# Patient Record
Sex: Female | Born: 1970 | Race: White | Hispanic: No | Marital: Married | Smoking: Former smoker
Health system: Southern US, Community
[De-identification: ages and names within clinical notes are randomized; demographics above are authoritative.]

## PROBLEM LIST (undated history)

## (undated) DIAGNOSIS — Z973 Presence of spectacles and contact lenses: Secondary | ICD-10-CM

## (undated) DIAGNOSIS — R112 Nausea with vomiting, unspecified: Secondary | ICD-10-CM

## (undated) DIAGNOSIS — Z8709 Personal history of other diseases of the respiratory system: Secondary | ICD-10-CM

## (undated) DIAGNOSIS — Z9889 Other specified postprocedural states: Secondary | ICD-10-CM

## (undated) DIAGNOSIS — R7303 Prediabetes: Secondary | ICD-10-CM

## (undated) DIAGNOSIS — G629 Polyneuropathy, unspecified: Secondary | ICD-10-CM

## (undated) HISTORY — PX: CARPAL TUNNEL RELEASE: SHX101

## (undated) HISTORY — PX: CHOLECYSTECTOMY: SHX55

---

## 2016-03-11 HISTORY — PX: BACK SURGERY: SHX140

## 2016-07-17 ENCOUNTER — Other Ambulatory Visit: Payer: Self-pay | Admitting: Neurosurgery

## 2016-08-10 ENCOUNTER — Encounter (HOSPITAL_COMMUNITY): Payer: Self-pay | Admitting: General Practice

## 2016-08-10 ENCOUNTER — Encounter (HOSPITAL_COMMUNITY)
Admission: RE | Admit: 2016-08-10 | Discharge: 2016-08-10 | Disposition: A | Discharge status: Home / Self Care | Payer: BLUE CROSS/BLUE SHIELD | Source: Ambulatory Visit | Attending: Neurosurgery | Admitting: Neurosurgery

## 2016-08-10 DIAGNOSIS — E119 Type 2 diabetes mellitus without complications: Secondary | ICD-10-CM | POA: Insufficient documentation

## 2016-08-10 DIAGNOSIS — Z01818 Encounter for other preprocedural examination: Secondary | ICD-10-CM | POA: Insufficient documentation

## 2016-08-10 HISTORY — DX: Presence of spectacles and contact lenses: Z97.3

## 2016-08-10 HISTORY — DX: Prediabetes: R73.03

## 2016-08-10 HISTORY — DX: Other specified postprocedural states: Z98.890

## 2016-08-10 HISTORY — DX: Nausea with vomiting, unspecified: R11.2

## 2016-08-10 HISTORY — DX: Personal history of other diseases of the respiratory system: Z87.09

## 2016-08-10 HISTORY — DX: Polyneuropathy, unspecified: G62.9

## 2016-08-10 LAB — BASIC METABOLIC PANEL
ANION GAP: 8 (ref 5–15)
BUN: 10 mg/dL (ref 6–20)
CO2: 22 mmol/L (ref 22–32)
CREATININE: 0.74 mg/dL (ref 0.44–1.00)
Calcium: 9 mg/dL (ref 8.9–10.3)
Chloride: 107 mmol/L (ref 101–111)
Glucose, Bld: 104 mg/dL — ABNORMAL HIGH (ref 65–99)
Potassium: 3.8 mmol/L (ref 3.5–5.1)
SODIUM: 137 mmol/L (ref 135–145)

## 2016-08-10 LAB — SURGICAL PCR SCREEN
MRSA, PCR: NEGATIVE
Staphylococcus aureus: NEGATIVE

## 2016-08-10 LAB — CBC
HCT: 40.2 % (ref 36.0–46.0)
HEMOGLOBIN: 13.6 g/dL (ref 12.0–15.0)
MCH: 29.8 pg (ref 26.0–34.0)
MCHC: 33.8 g/dL (ref 30.0–36.0)
MCV: 88.2 fL (ref 78.0–100.0)
PLATELETS: 275 10*3/uL (ref 150–400)
RBC: 4.56 MIL/uL (ref 3.87–5.11)
RDW: 13.3 % (ref 11.5–15.5)
WBC: 8.2 10*3/uL (ref 4.0–10.5)

## 2016-08-10 LAB — TYPE AND SCREEN
ABO/RH(D): O POS
ANTIBODY SCREEN: NEGATIVE

## 2016-08-10 LAB — ABO/RH: ABO/RH(D): O POS

## 2016-08-10 LAB — HCG, SERUM, QUALITATIVE: PREG SERUM: NEGATIVE

## 2016-08-10 NOTE — Progress Notes (Signed)
PCP - if needed, goes to [facility] [place] Cardiologist - denies  EKG/CXR - denies Echo/stress test/cardiac cath - denies  Patient denies chest pain and shortness of breath at PAT appointment.

## 2016-08-10 NOTE — Pre-Procedure Instructions (Signed)
    Linde GillisStephanie B Mayse  08/10/2016      [id number]- [place], [place] - [address] [facility] [address] [place] [id number] Phone: [phone] Fax: [phone]    Your procedure is scheduled on Thursday 08/17/2016 @1055   [facility] Admitting at  [phone].  Call this number if you have problems the morning of surgery:  [phone]   Remember:  Discontinue all aspirin products, NSAIDS - aleve/naproxen, advil/ ibuprofen, vitamin and herbal supplements fish oils 5 days prior to surgery or as instructed by your surgeon.    Do not eat food or drink liquids after midnight.  Take these medicines the morning of surgery with A SIP OF WATER  Gabapentin (Neurontin), Claritin, Loestrin/Junel   Do not wear jewelry, make-up or nail polish.  Do not wear lotions, powders, or perfumes, or deoderant.  Do not shave 48 hours prior to surgery.    Do not bring valuables to the hospital.  [facility] is not responsible for any belongings or valuables.  Contacts, dentures or bridgework may not be worn into surgery.  Leave your suitcase in the car.  After surgery it may be brought to your room.  For patients admitted to the hospital, discharge time will be determined by your treatment team.  Patients discharged the day of surgery will not be allowed to drive home.   Name and phone number of your driver:    Special instructions:   Please read over the following fact sheets that you were given. Pain Booklet, Coughing and Deep Breathing, Blood Transfusion Information, MRSA Information and Surgical Site Infection Prevention

## 2016-08-11 LAB — HEMOGLOBIN A1C
Hgb A1c MFr Bld: 5.9 % — ABNORMAL HIGH (ref 4.8–5.6)
MEAN PLASMA GLUCOSE: 123 mg/dL

## 2016-08-17 ENCOUNTER — Inpatient Hospital Stay (HOSPITAL_COMMUNITY): Payer: BLUE CROSS/BLUE SHIELD | Admitting: Certified Registered Nurse Anesthetist

## 2016-08-17 ENCOUNTER — Encounter (HOSPITAL_COMMUNITY)
Admission: RE | Disposition: A | Discharge status: Home / Self Care | Payer: Self-pay | Source: Ambulatory Visit | Attending: Neurosurgery

## 2016-08-17 ENCOUNTER — Inpatient Hospital Stay (HOSPITAL_COMMUNITY): Payer: BLUE CROSS/BLUE SHIELD

## 2016-08-17 ENCOUNTER — Inpatient Hospital Stay (HOSPITAL_COMMUNITY)
Admission: RE | Admit: 2016-08-17 | Discharge: 2016-08-18 | DRG: 455 | Disposition: A | Discharge status: Home / Self Care | Payer: BLUE CROSS/BLUE SHIELD | Source: Ambulatory Visit | Attending: Neurosurgery | Admitting: Neurosurgery

## 2016-08-17 ENCOUNTER — Encounter (HOSPITAL_COMMUNITY): Payer: Self-pay | Admitting: Certified Registered Nurse Anesthetist

## 2016-08-17 DIAGNOSIS — Z79891 Long term (current) use of opiate analgesic: Secondary | ICD-10-CM

## 2016-08-17 DIAGNOSIS — M48061 Spinal stenosis, lumbar region without neurogenic claudication: Principal | ICD-10-CM | POA: Diagnosis present

## 2016-08-17 DIAGNOSIS — M5440 Lumbago with sciatica, unspecified side: Secondary | ICD-10-CM | POA: Diagnosis present

## 2016-08-17 DIAGNOSIS — M4726 Other spondylosis with radiculopathy, lumbar region: Secondary | ICD-10-CM | POA: Diagnosis present

## 2016-08-17 DIAGNOSIS — Z79899 Other long term (current) drug therapy: Secondary | ICD-10-CM | POA: Diagnosis not present

## 2016-08-17 DIAGNOSIS — Z6835 Body mass index (BMI) 35.0-35.9, adult: Secondary | ICD-10-CM | POA: Diagnosis not present

## 2016-08-17 DIAGNOSIS — Z419 Encounter for procedure for purposes other than remedying health state, unspecified: Secondary | ICD-10-CM

## 2016-08-17 DIAGNOSIS — Z87891 Personal history of nicotine dependence: Secondary | ICD-10-CM | POA: Diagnosis not present

## 2016-08-17 DIAGNOSIS — M5126 Other intervertebral disc displacement, lumbar region: Secondary | ICD-10-CM | POA: Diagnosis present

## 2016-08-17 HISTORY — PX: MAXIMUM ACCESS (MAS)POSTERIOR LUMBAR INTERBODY FUSION (PLIF) 1 LEVEL: SHX6368

## 2016-08-17 SURGERY — FOR MAXIMUM ACCESS (MAS) POSTERIOR LUMBAR INTERBODY FUSION (PLIF) 1 LEVEL
Anesthesia: General | Site: Back

## 2016-08-17 MED ORDER — HYDROMORPHONE HCL 1 MG/ML IJ SOLN
0.5000 mg | INTRAMUSCULAR | Status: DC | PRN
  Administered 2016-08-17 – 2016-08-18 (×3): 1 mg via INTRAVENOUS
  Filled 2016-08-17 (×3): qty 1

## 2016-08-17 MED ORDER — ONDANSETRON HCL 4 MG/2ML IJ SOLN
INTRAMUSCULAR | Status: DC | PRN
  Administered 2016-08-17 (×2): 4 mg via INTRAVENOUS

## 2016-08-17 MED ORDER — BUPIVACAINE LIPOSOME 1.3 % IJ SUSP
INTRAMUSCULAR | Status: DC | PRN
  Administered 2016-08-17: 20 mL

## 2016-08-17 MED ORDER — CEFAZOLIN SODIUM-DEXTROSE 2-4 GM/100ML-% IV SOLN
INTRAVENOUS | Status: AC
  Filled 2016-08-17: qty 100

## 2016-08-17 MED ORDER — THROMBIN 5000 UNITS EX SOLR
CUTANEOUS | Status: AC
  Filled 2016-08-17: qty 5000

## 2016-08-17 MED ORDER — LIDOCAINE-EPINEPHRINE (PF) 2 %-1:200000 IJ SOLN
INTRAMUSCULAR | Status: AC
  Filled 2016-08-17: qty 20

## 2016-08-17 MED ORDER — PHENYLEPHRINE 40 MCG/ML (10ML) SYRINGE FOR IV PUSH (FOR BLOOD PRESSURE SUPPORT)
PREFILLED_SYRINGE | INTRAVENOUS | Status: AC
  Filled 2016-08-17: qty 10

## 2016-08-17 MED ORDER — METHOCARBAMOL 1000 MG/10ML IJ SOLN
500.0000 mg | INTRAVENOUS | Status: AC
  Filled 2016-08-17: qty 5

## 2016-08-17 MED ORDER — DOCUSATE SODIUM 100 MG PO CAPS
100.0000 mg | ORAL_CAPSULE | Freq: Two times a day (BID) | ORAL | Status: DC
  Administered 2016-08-17 – 2016-08-18 (×2): 100 mg via ORAL
  Filled 2016-08-17 (×2): qty 1

## 2016-08-17 MED ORDER — MENTHOL 3 MG MT LOZG
1.0000 | LOZENGE | OROMUCOSAL | Status: DC | PRN
Start: 2016-08-17 — End: 2016-08-18

## 2016-08-17 MED ORDER — BUPIVACAINE LIPOSOME 1.3 % IJ SUSP
20.0000 mL | INTRAMUSCULAR | Status: DC
  Filled 2016-08-17: qty 20

## 2016-08-17 MED ORDER — PHENOL 1.4 % MT LIQD
1.0000 | OROMUCOSAL | Status: DC | PRN
Start: 2016-08-17 — End: 2016-08-18

## 2016-08-17 MED ORDER — ONDANSETRON HCL 4 MG/2ML IJ SOLN
INTRAMUSCULAR | Status: AC
  Filled 2016-08-17: qty 8

## 2016-08-17 MED ORDER — HYDROCODONE-ACETAMINOPHEN 5-325 MG PO TABS: 1.0000 | ORAL_TABLET | ORAL | Status: DC | PRN

## 2016-08-17 MED ORDER — EPHEDRINE 5 MG/ML INJ
INTRAVENOUS | Status: AC
  Filled 2016-08-17: qty 10

## 2016-08-17 MED ORDER — PROPOFOL 10 MG/ML IV BOLUS
INTRAVENOUS | Status: AC
  Filled 2016-08-17: qty 20

## 2016-08-17 MED ORDER — LACTATED RINGERS IV SOLN
INTRAVENOUS | Status: DC
  Administered 2016-08-17: 50 mL/h via INTRAVENOUS
  Administered 2016-08-17 (×2): via INTRAVENOUS

## 2016-08-17 MED ORDER — ATROPINE SULFATE 0.4 MG/ML IV SOSY
PREFILLED_SYRINGE | INTRAVENOUS | Status: AC
  Filled 2016-08-17: qty 2.5

## 2016-08-17 MED ORDER — PANTOPRAZOLE SODIUM 40 MG IV SOLR: 40.0000 mg | Freq: Every day | INTRAVENOUS | Status: DC

## 2016-08-17 MED ORDER — ATROPINE SULFATE 0.4 MG/ML IJ SOLN
INTRAMUSCULAR | Status: DC | PRN
  Administered 2016-08-17: .1 mg via INTRAVENOUS

## 2016-08-17 MED ORDER — METHOCARBAMOL 1000 MG/10ML IJ SOLN
500.0000 mg | Freq: Four times a day (QID) | INTRAVENOUS | Status: DC | PRN
  Filled 2016-08-17: qty 5

## 2016-08-17 MED ORDER — ROCURONIUM BROMIDE 10 MG/ML (PF) SYRINGE
PREFILLED_SYRINGE | INTRAVENOUS | Status: AC
  Filled 2016-08-17: qty 10

## 2016-08-17 MED ORDER — THROMBIN 5000 UNITS EX SOLR
OROMUCOSAL | Status: DC | PRN
  Administered 2016-08-17: 12:00:00 via TOPICAL

## 2016-08-17 MED ORDER — KCL IN DEXTROSE-NACL 20-5-0.45 MEQ/L-%-% IV SOLN: INTRAVENOUS | Status: DC

## 2016-08-17 MED ORDER — METHOCARBAMOL 500 MG PO TABS
500.0000 mg | ORAL_TABLET | Freq: Four times a day (QID) | ORAL | Status: DC | PRN
  Administered 2016-08-17 – 2016-08-18 (×2): 500 mg via ORAL
  Filled 2016-08-17 (×2): qty 1

## 2016-08-17 MED ORDER — SENNOSIDES-DOCUSATE SODIUM 8.6-50 MG PO TABS: 1.0000 | ORAL_TABLET | Freq: Every evening | ORAL | Status: DC | PRN

## 2016-08-17 MED ORDER — ONDANSETRON HCL 4 MG/2ML IJ SOLN: 4.0000 mg | INTRAMUSCULAR | Status: DC | PRN

## 2016-08-17 MED ORDER — BISACODYL 10 MG RE SUPP: 10.0000 mg | Freq: Every day | RECTAL | Status: DC | PRN

## 2016-08-17 MED ORDER — ACETAMINOPHEN 10 MG/ML IV SOLN
INTRAVENOUS | Status: AC
  Administered 2016-08-17: 1000 mg
  Filled 2016-08-17: qty 100

## 2016-08-17 MED ORDER — FENTANYL CITRATE (PF) 100 MCG/2ML IJ SOLN
INTRAMUSCULAR | Status: AC
  Filled 2016-08-17: qty 4

## 2016-08-17 MED ORDER — DEXMEDETOMIDINE BOLUS VIA INFUSION
1.0000 ug/kg | Freq: Once | INTRAVENOUS | Status: AC
  Administered 2016-08-17: 94.9 ug via INTRAVENOUS

## 2016-08-17 MED ORDER — PROPOFOL 10 MG/ML IV BOLUS
INTRAVENOUS | Status: DC | PRN
  Administered 2016-08-17: 200 mg via INTRAVENOUS

## 2016-08-17 MED ORDER — EPHEDRINE SULFATE 50 MG/ML IJ SOLN
INTRAMUSCULAR | Status: DC | PRN
  Administered 2016-08-17: 5 mg via INTRAVENOUS
  Administered 2016-08-17: 10 mg via INTRAVENOUS

## 2016-08-17 MED ORDER — GLYCOPYRROLATE 0.2 MG/ML IJ SOLN
INTRAMUSCULAR | Status: DC | PRN
  Administered 2016-08-17: .2 mg via INTRAVENOUS

## 2016-08-17 MED ORDER — CEFAZOLIN IN D5W 1 GM/50ML IV SOLN
1.0000 g | Freq: Three times a day (TID) | INTRAVENOUS | Status: AC
  Administered 2016-08-17 – 2016-08-18 (×2): 1 g via INTRAVENOUS
  Filled 2016-08-17 (×2): qty 50

## 2016-08-17 MED ORDER — 0.9 % SODIUM CHLORIDE (POUR BTL) OPTIME
TOPICAL | Status: DC | PRN
  Administered 2016-08-17: 1000 mL

## 2016-08-17 MED ORDER — PHENYLEPHRINE 40 MCG/ML (10ML) SYRINGE FOR IV PUSH (FOR BLOOD PRESSURE SUPPORT)
PREFILLED_SYRINGE | INTRAVENOUS | Status: DC | PRN
  Administered 2016-08-17: 40 ug via INTRAVENOUS
  Administered 2016-08-17 (×2): 80 ug via INTRAVENOUS
  Administered 2016-08-17: 40 ug via INTRAVENOUS
  Administered 2016-08-17: 80 ug via INTRAVENOUS

## 2016-08-17 MED ORDER — THROMBIN 20000 UNITS EX SOLR
CUTANEOUS | Status: AC
  Filled 2016-08-17: qty 20000

## 2016-08-17 MED ORDER — FENTANYL CITRATE (PF) 100 MCG/2ML IJ SOLN
INTRAMUSCULAR | Status: DC | PRN
  Administered 2016-08-17 (×2): 25 ug via INTRAVENOUS
  Administered 2016-08-17 (×3): 50 ug via INTRAVENOUS
  Administered 2016-08-17 (×3): 25 ug via INTRAVENOUS
  Administered 2016-08-17: 50 ug via INTRAVENOUS
  Administered 2016-08-17: 75 ug via INTRAVENOUS

## 2016-08-17 MED ORDER — CHLORHEXIDINE GLUCONATE CLOTH 2 % EX PADS: 6.0000 | MEDICATED_PAD | Freq: Once | CUTANEOUS | Status: DC

## 2016-08-17 MED ORDER — ARTIFICIAL TEARS OP OINT
TOPICAL_OINTMENT | OPHTHALMIC | Status: AC
  Filled 2016-08-17: qty 3.5

## 2016-08-17 MED ORDER — ACETAMINOPHEN 325 MG PO TABS: 650.0000 mg | ORAL_TABLET | ORAL | Status: DC | PRN

## 2016-08-17 MED ORDER — LIDOCAINE 2% (20 MG/ML) 5 ML SYRINGE
INTRAMUSCULAR | Status: AC
  Filled 2016-08-17: qty 5

## 2016-08-17 MED ORDER — DEXAMETHASONE SODIUM PHOSPHATE 10 MG/ML IJ SOLN
INTRAMUSCULAR | Status: DC | PRN
  Administered 2016-08-17: 10 mg via INTRAVENOUS

## 2016-08-17 MED ORDER — HYDROMORPHONE HCL 1 MG/ML IJ SOLN
0.2500 mg | INTRAMUSCULAR | Status: DC | PRN
  Administered 2016-08-17 (×4): 0.5 mg via INTRAVENOUS

## 2016-08-17 MED ORDER — ZOLPIDEM TARTRATE 5 MG PO TABS: 5.0000 mg | ORAL_TABLET | Freq: Every evening | ORAL | Status: DC | PRN

## 2016-08-17 MED ORDER — ALUM & MAG HYDROXIDE-SIMETH 200-200-20 MG/5ML PO SUSP: 30.0000 mL | Freq: Four times a day (QID) | ORAL | Status: DC | PRN

## 2016-08-17 MED ORDER — MIDAZOLAM HCL 2 MG/2ML IJ SOLN
INTRAMUSCULAR | Status: AC
  Filled 2016-08-17: qty 2

## 2016-08-17 MED ORDER — FENTANYL CITRATE (PF) 100 MCG/2ML IJ SOLN
INTRAMUSCULAR | Status: AC
  Filled 2016-08-17: qty 2

## 2016-08-17 MED ORDER — PROPOFOL 500 MG/50ML IV EMUL
INTRAVENOUS | Status: DC | PRN
  Administered 2016-08-17: 50 ug/kg/min via INTRAVENOUS

## 2016-08-17 MED ORDER — NORETHIN ACE-ETH ESTRAD-FE 1-20 MG-MCG PO TABS
1.0000 | ORAL_TABLET | Freq: Every day | ORAL | Status: DC
  Administered 2016-08-18: 1 via ORAL

## 2016-08-17 MED ORDER — SODIUM CHLORIDE 0.9% FLUSH
3.0000 mL | Freq: Two times a day (BID) | INTRAVENOUS | Status: DC
  Administered 2016-08-18: 3 mL via INTRAVENOUS

## 2016-08-17 MED ORDER — MEPERIDINE HCL 25 MG/ML IJ SOLN: 6.2500 mg | INTRAMUSCULAR | Status: DC | PRN

## 2016-08-17 MED ORDER — LIDOCAINE 2% (20 MG/ML) 5 ML SYRINGE
INTRAMUSCULAR | Status: DC | PRN
  Administered 2016-08-17: 80 mg via INTRAVENOUS

## 2016-08-17 MED ORDER — PROPOFOL 10 MG/ML IV BOLUS
INTRAVENOUS | Status: AC
Start: 2016-08-17 — End: 2016-08-17
  Filled 2016-08-17: qty 60

## 2016-08-17 MED ORDER — ESCITALOPRAM OXALATE 20 MG PO TABS
20.0000 mg | ORAL_TABLET | Freq: Every day | ORAL | Status: DC
  Administered 2016-08-18: 20 mg via ORAL
  Filled 2016-08-17: qty 1

## 2016-08-17 MED ORDER — CEFAZOLIN SODIUM-DEXTROSE 2-4 GM/100ML-% IV SOLN
2.0000 g | INTRAVENOUS | Status: AC
  Administered 2016-08-17: 2 g via INTRAVENOUS

## 2016-08-17 MED ORDER — SODIUM CHLORIDE 0.9% FLUSH: 3.0000 mL | INTRAVENOUS | Status: DC | PRN

## 2016-08-17 MED ORDER — VITAMIN D 1000 UNITS PO TABS
2000.0000 [IU] | ORAL_TABLET | Freq: Every day | ORAL | Status: DC
  Administered 2016-08-18: 2000 [IU] via ORAL
  Filled 2016-08-17: qty 2

## 2016-08-17 MED ORDER — ONDANSETRON HCL 4 MG/2ML IJ SOLN
INTRAMUSCULAR | Status: AC
  Filled 2016-08-17: qty 2

## 2016-08-17 MED ORDER — PANTOPRAZOLE SODIUM 40 MG PO TBEC
40.0000 mg | DELAYED_RELEASE_TABLET | Freq: Every day | ORAL | Status: DC
  Administered 2016-08-17: 40 mg via ORAL
  Filled 2016-08-17: qty 1

## 2016-08-17 MED ORDER — OXYCODONE-ACETAMINOPHEN 5-325 MG PO TABS
1.0000 | ORAL_TABLET | ORAL | Status: DC | PRN
  Administered 2016-08-17 – 2016-08-18 (×3): 2 via ORAL
  Filled 2016-08-17 (×3): qty 2

## 2016-08-17 MED ORDER — DEXTROSE 5 % IV SOLN
500.0000 mg | INTRAVENOUS | Status: DC
  Administered 2016-08-17: 500 mg via INTRAVENOUS
  Filled 2016-08-17: qty 5

## 2016-08-17 MED ORDER — DEXAMETHASONE SODIUM PHOSPHATE 10 MG/ML IJ SOLN
INTRAMUSCULAR | Status: AC
  Filled 2016-08-17: qty 1

## 2016-08-17 MED ORDER — SALINE SPRAY 0.65 % NA SOLN
1.0000 | NASAL | Status: DC | PRN
  Filled 2016-08-17: qty 44

## 2016-08-17 MED ORDER — ACETAMINOPHEN 650 MG RE SUPP: 650.0000 mg | RECTAL | Status: DC | PRN

## 2016-08-17 MED ORDER — SURGIFOAM 100 EX MISC
CUTANEOUS | Status: DC | PRN
  Administered 2016-08-17: 12:00:00 via TOPICAL

## 2016-08-17 MED ORDER — LORATADINE 10 MG PO TABS
10.0000 mg | ORAL_TABLET | Freq: Every day | ORAL | Status: DC
  Administered 2016-08-18: 10 mg via ORAL
  Filled 2016-08-17: qty 1

## 2016-08-17 MED ORDER — GABAPENTIN 100 MG PO CAPS
100.0000 mg | ORAL_CAPSULE | Freq: Three times a day (TID) | ORAL | Status: DC
  Administered 2016-08-17 – 2016-08-18 (×3): 100 mg via ORAL
  Filled 2016-08-17 (×3): qty 1

## 2016-08-17 MED ORDER — FLEET ENEMA 7-19 GM/118ML RE ENEM: 1.0000 | ENEMA | Freq: Once | RECTAL | Status: DC | PRN

## 2016-08-17 MED ORDER — HYDROMORPHONE HCL 1 MG/ML IJ SOLN
INTRAMUSCULAR | Status: AC
  Administered 2016-08-17: 0.5 mg via INTRAVENOUS
  Filled 2016-08-17: qty 1

## 2016-08-17 MED ORDER — ONDANSETRON HCL 4 MG/2ML IJ SOLN: 4.0000 mg | Freq: Once | INTRAMUSCULAR | Status: DC | PRN

## 2016-08-17 MED ORDER — BUPIVACAINE HCL (PF) 0.5 % IJ SOLN
INTRAMUSCULAR | Status: DC | PRN
  Administered 2016-08-17: 10 mL

## 2016-08-17 MED ORDER — MIDAZOLAM HCL 5 MG/5ML IJ SOLN
INTRAMUSCULAR | Status: DC | PRN
  Administered 2016-08-17 (×2): 1 mg via INTRAVENOUS

## 2016-08-17 MED ORDER — BUPIVACAINE HCL (PF) 0.5 % IJ SOLN
INTRAMUSCULAR | Status: AC
Start: 2016-08-17 — End: 2016-08-17
  Filled 2016-08-17: qty 30

## 2016-08-17 MED ORDER — LIDOCAINE-EPINEPHRINE 1 %-1:100000 IJ SOLN
INTRAMUSCULAR | Status: DC | PRN
  Administered 2016-08-17: 10 mL

## 2016-08-17 MED ORDER — SUCCINYLCHOLINE CHLORIDE 200 MG/10ML IV SOSY
PREFILLED_SYRINGE | INTRAVENOUS | Status: DC | PRN
  Administered 2016-08-17: 40 mg via INTRAVENOUS
  Administered 2016-08-17: 120 mg via INTRAVENOUS

## 2016-08-17 SURGICAL SUPPLY — 83 items
BASKET BONE COLLECTION (BASKET) ×4 IMPLANT
BLADE CLIPPER SURG (BLADE) IMPLANT
BONE CANC CHIPS 20CC PCAN1/4 (Bone Implant) ×3 IMPLANT
BONE MATRIX OSTEOCEL PRO MED (Bone Implant) ×3 IMPLANT
BUR MATCHSTICK NEURO 3.0 LAGG (BURR) ×3 IMPLANT
BUR ROUND FLUTED 5 RND (BURR) ×2 IMPLANT
BUR ROUND FLUTED 5MM RND (BURR) ×1
CAGE PLIF 8X9X23-12 LUMBAR (Cage) ×6 IMPLANT
CANISTER SUCT 3000ML PPV (MISCELLANEOUS) ×3 IMPLANT
CAP RELINE MOD TULIP RMM (Cap) ×6 IMPLANT
CARTRIDGE OIL MAESTRO DRILL (MISCELLANEOUS) ×1 IMPLANT
CHIPS CANC BONE 20CC PCAN1/4 (Bone Implant) ×1 IMPLANT
CLIP NEUROVISION LG (CLIP) ×3 IMPLANT
CONT SPEC 4OZ CLIKSEAL STRL BL (MISCELLANEOUS) ×3 IMPLANT
COVER BACK TABLE 24X17X13 BIG (DRAPES) IMPLANT
COVER BACK TABLE 60X90IN (DRAPES) ×3 IMPLANT
DECANTER SPIKE VIAL GLASS SM (MISCELLANEOUS) ×3 IMPLANT
DERMABOND ADVANCED (GAUZE/BANDAGES/DRESSINGS) ×2
DERMABOND ADVANCED .7 DNX12 (GAUZE/BANDAGES/DRESSINGS) ×1 IMPLANT
DIFFUSER DRILL AIR PNEUMATIC (MISCELLANEOUS) ×3 IMPLANT
DRAPE C-ARM 42X72 X-RAY (DRAPES) ×3 IMPLANT
DRAPE C-ARMOR (DRAPES) ×3 IMPLANT
DRAPE LAPAROTOMY 100X72X124 (DRAPES) ×3 IMPLANT
DRAPE POUCH INSTRU U-SHP 10X18 (DRAPES) ×3 IMPLANT
DRAPE SURG 17X23 STRL (DRAPES) ×3 IMPLANT
DRSG OPSITE POSTOP 4X6 (GAUZE/BANDAGES/DRESSINGS) ×3 IMPLANT
DURAPREP 26ML APPLICATOR (WOUND CARE) ×3 IMPLANT
ELECT BLADE 4.0 EZ CLEAN MEGAD (MISCELLANEOUS) ×3
ELECT REM PT RETURN 9FT ADLT (ELECTROSURGICAL) ×3
ELECTRODE BLDE 4.0 EZ CLN MEGD (MISCELLANEOUS) ×1 IMPLANT
ELECTRODE REM PT RTRN 9FT ADLT (ELECTROSURGICAL) ×1 IMPLANT
EVACUATOR 1/8 PVC DRAIN (DRAIN) ×3 IMPLANT
GAUZE SPONGE 4X4 12PLY STRL (GAUZE/BANDAGES/DRESSINGS) ×3 IMPLANT
GAUZE SPONGE 4X4 16PLY XRAY LF (GAUZE/BANDAGES/DRESSINGS) IMPLANT
GLOVE BIO SURGEON STRL SZ7 (GLOVE) ×12 IMPLANT
GLOVE BIO SURGEON STRL SZ8 (GLOVE) ×6 IMPLANT
GLOVE BIOGEL PI IND STRL 8 (GLOVE) ×2 IMPLANT
GLOVE BIOGEL PI IND STRL 8.5 (GLOVE) ×2 IMPLANT
GLOVE BIOGEL PI INDICATOR 8 (GLOVE) ×4
GLOVE BIOGEL PI INDICATOR 8.5 (GLOVE) ×4
GLOVE ECLIPSE 8.0 STRL XLNG CF (GLOVE) ×6 IMPLANT
GLOVE EXAM NITRILE LRG STRL (GLOVE) IMPLANT
GLOVE EXAM NITRILE XL STR (GLOVE) IMPLANT
GLOVE EXAM NITRILE XS STR PU (GLOVE) IMPLANT
GLOVE INDICATOR 7.5 STRL GRN (GLOVE) ×12 IMPLANT
GOWN STRL REUS W/ TWL LRG LVL3 (GOWN DISPOSABLE) IMPLANT
GOWN STRL REUS W/ TWL XL LVL3 (GOWN DISPOSABLE) ×1 IMPLANT
GOWN STRL REUS W/TWL 2XL LVL3 (GOWN DISPOSABLE) ×3 IMPLANT
GOWN STRL REUS W/TWL LRG LVL3 (GOWN DISPOSABLE)
GOWN STRL REUS W/TWL XL LVL3 (GOWN DISPOSABLE) ×2
HEMOSTAT POWDER SURGIFOAM 1G (HEMOSTASIS) ×3 IMPLANT
KIT BASIN OR (CUSTOM PROCEDURE TRAY) ×3 IMPLANT
KIT POSITION SURG JACKSON T1 (MISCELLANEOUS) ×3 IMPLANT
KIT ROOM TURNOVER OR (KITS) ×3 IMPLANT
MILL MEDIUM DISP (BLADE) ×3 IMPLANT
MODULE NVM5 NEXT GEN EMG (NEEDLE) ×3 IMPLANT
NEEDLE HYPO 25X1 1.5 SAFETY (NEEDLE) ×3 IMPLANT
NEEDLE SPNL 18GX3.5 QUINCKE PK (NEEDLE) IMPLANT
NS IRRIG 1000ML POUR BTL (IV SOLUTION) ×3 IMPLANT
OIL CARTRIDGE MAESTRO DRILL (MISCELLANEOUS) ×3
PACK LAMINECTOMY NEURO (CUSTOM PROCEDURE TRAY) ×3 IMPLANT
PAD ARMBOARD 7.5X6 YLW CONV (MISCELLANEOUS) ×9 IMPLANT
PATTIES SURGICAL .5 X.5 (GAUZE/BANDAGES/DRESSINGS) IMPLANT
PATTIES SURGICAL .5 X1 (DISPOSABLE) IMPLANT
PATTIES SURGICAL 1X1 (DISPOSABLE) IMPLANT
ROD RELINE COCR LORD 5.0X35 (Rod) ×3 IMPLANT
ROD RELINE COCR LORD 5X40MM (Rod) ×3 IMPLANT
SCREW LOCK RSS 4.5/5.0MM (Screw) ×12 IMPLANT
SCREW RELINE RMM 6.5X35 4S (Screw) ×6 IMPLANT
SCREW SHANK RELINE MOD 5.5X35 (Screw) ×6 IMPLANT
SPONGE LAP 4X18 X RAY DECT (DISPOSABLE) IMPLANT
SPONGE SURGIFOAM ABS GEL 100 (HEMOSTASIS) ×3 IMPLANT
STAPLER SKIN PROX WIDE 3.9 (STAPLE) IMPLANT
SUT VIC AB 1 CT1 18XBRD ANBCTR (SUTURE) ×2 IMPLANT
SUT VIC AB 1 CT1 8-18 (SUTURE) ×4
SUT VIC AB 2-0 CT1 18 (SUTURE) ×6 IMPLANT
SUT VIC AB 3-0 SH 8-18 (SUTURE) ×6 IMPLANT
SYR 5ML LL (SYRINGE) IMPLANT
SYRINGE 20CC LL (MISCELLANEOUS) ×3 IMPLANT
TOWEL OR 17X24 6PK STRL BLUE (TOWEL DISPOSABLE) ×6 IMPLANT
TOWEL OR 17X26 10 PK STRL BLUE (TOWEL DISPOSABLE) ×3 IMPLANT
TRAY FOLEY W/METER SILVER 16FR (SET/KITS/TRAYS/PACK) ×3 IMPLANT
WATER STERILE IRR 1000ML POUR (IV SOLUTION) ×3 IMPLANT

## 2016-08-17 NOTE — Anesthesia Procedure Notes (Signed)
Procedure Name: Intubation Date/Time: 08/17/2016 12:35 PM Performed by: Daiva EvesAVENEL, Era Parr W Pre-anesthesia Checklist: Patient identified, Emergency Drugs available, Suction available, Patient being monitored and Timeout performed Patient Re-evaluated:Patient Re-evaluated prior to inductionOxygen Delivery Method: Circle system utilized Preoxygenation: Pre-oxygenation with 100% oxygen Intubation Type: IV induction Ventilation: Mask ventilation without difficulty Laryngoscope Size: Glidescope and 4 Grade View: Grade I Tube type: Oral Tube size: 7.0 mm Number of attempts: 2 Airway Equipment and Method: Stylet and Video-laryngoscopy Placement Confirmation: positive ETCO2,  breath sounds checked- equal and bilateral and ETT inserted through vocal cords under direct vision Secured at: 22 cm Tube secured with: Tape Dental Injury: Teeth and Oropharynx as per pre-operative assessment  Difficulty Due To: Difficulty was unanticipated Comments: Very tight oral opening after induction with succs (+promintent teeth). Grade 3 view with MAC 3- epiglottis view only. esoph intub- ett removed and then glidescope 4 utilized- grade 1 view.

## 2016-08-17 NOTE — OR Nursing (Signed)
Dr. Noreene LarssonJoslin at bedside.  Pt continues with 10/10 pain after Dilaudid, Robaxin and IV tylenol.  Precedex ordered and given.

## 2016-08-17 NOTE — Progress Notes (Addendum)
Patient ID: Audrey GillisStephanie B Mcmullen, female   DOB: 08/16/1971, 45 y.o.   MRN: [id number] Just arrived to 3C02, alert, ambulates to bed, reporting no pain at present.  Recalls lumbar pain earlier, "but none now".  Good strength BLE.   Patient is doing well.

## 2016-08-17 NOTE — Op Note (Signed)
08/17/2016  2:58 PM  PATIENT:  Audrey Mora  45 y.o. 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, degenerative disc disease, recurrent disc herniation, radiculopathy, lumbago, morbid obesity   PROCEDURE:  Procedure(s): LUMBAR FIVE -SACRAL ONE REDO AND DISKECTOMY DECOMPRESSION WITH MAXIMUM ACCESS POSTERIOR LUMBAR INTERBODY FUSION (N/A) with Posterolateral arthrodesis L 5 - S 1 levels  Decompression greater than for standard PLIF procedure  SURGEON:  Surgeon(s) and Role:    * Del Wiseman, MD - Primary    * Gary Cram, MD - Assisting  PHYSICIAN ASSISTANT:   ASSISTANTS: Poteat, RN   ANESTHESIA:   general  EBL:  Total I/O In: 2000 [I.V.:2000] Out: 400 [Urine:150; Blood:250]  BLOOD ADMINISTERED:none  DRAINS: none   LOCAL MEDICATIONS USED:  MARCAINE    and LIDOCAINE   SPECIMEN:  No Specimen  DISPOSITION OF SPECIMEN:  N/A  COUNTS:  YES  TOURNIQUET:  * No tourniquets in log *  DICTATION: DICTATION: Patient is a 45-year-old with spondylosis , stenosis, recurrent disc herniation and severe back and left lower extremity pain at L5 S 1 levels of the lumbar spine. It was elected to take her to surgery for MASPLIF L 5 S 1 level with posterolateral arthrodesis.  Procedure:   Following uncomplicated induction of GETA, and placement of electrodes for neural monitoring, patient was turned into a prone position on the Jackson tableand using AP  fluoroscopy the area of planned incision was marked, prepped with betadine scrub and Duraprep, then draped. Exposure was performed of facet joint complex at L 5 S 1 level and the MAS retractor was placed.5.5 x 35 mm cortical Nuvasive screws were placed at L 5 bilaterally according to standard landmarks using neural monitoring.  A total  laminectomy of L 5 was then performed with disarticulation of facets.  This bone was saved for grafting, combined with Osteocel after being run through bone mill and was placed in bone packing device.  There was a large recurrent disc herniation, which was painstakingly mobilized and removed on both sides of the midline, particularly on the left, where there was dense scar tissue.  Decompression was greater than standard PLIF procedure.  Bilateral L 5 and S 1 nerve roots and thecal sac were carefully and completely decompressed.  Thorough discectomy was performed bilaterally at L 5 S 1 and the endplates were prepared for grafting.  23 x 8 x 12 degree cages were placed in the interspace and positioning was confirmed with AP and lateral fluoroscopy.  10 cc of autograft/Osteocel was packed in the interspace medial to the second cage.   Remaining screws were placed at S 1  and rods were placed.   And the screws were locked and torqued.Final Xrays showed well positioned implants and screw fixation. The posterolateral region was packed with remaining 10 cc of autograft on each side of midline. The wounds were irrigated and then closed with 1, 2-0 and 3-0 Vicryl stitches. Long-acting Marcaine was injected into the deep musculature.  Sterile occlusive dressing was placed with Dermabond. The patient was then extubated in the operating room and taken to recovery in stable and satisfactory condition having tolerated her operation well. Counts were correct at the end of the case.  PLAN OF CARE: Admit to inpatient   PATIENT DISPOSITION:  PACU - hemodynamically stable.   Delay   start of Pharmacological VTE agent (>24hrs) due to surgical blood loss or risk of bleeding: yes

## 2016-08-17 NOTE — Transfer of Care (Signed)
Immediate Anesthesia Transfer of Care Note  Patient: Linde GillisStephanie B Stober  Procedure(s) Performed: Procedure(s): LUMBAR FIVE -SACRAL ONE REDO AND DISKECTOMY DECOMPRESSION WITH MAXIMUM ACCESS POSTERIOR LUMBAR INTERBODY FUSION (N/A)  Patient Location: PACU  Anesthesia Type:General  Level of Consciousness: awake, alert  and oriented  Airway & Oxygen Therapy: Patient Spontanous Breathing and Patient connected to nasal cannula oxygen  Post-op Assessment: Report given to RN and Post -op Vital signs reviewed and stable  Post vital signs: Reviewed and stable  Last Vitals:  Vitals:   08/17/16 0916  BP: 127/75  Pulse: 81  Resp: 18  Temp: 36.9 C    Last Pain:  Vitals:   08/17/16 0935  TempSrc:   PainSc: 6       Patients Stated Pain Goal: 2 (08/17/16 0935)  Complications: No apparent anesthesia complications

## 2016-08-17 NOTE — H&P (Signed)
Patient ID:   [id number] Patient: Audrey Mora  Date of Birth: 29-Aug-1971 Visit Type: Office Visit   Date: 07/13/2016 01:45 PM Provider: Danae OrleansJoseph D. Venetia MaxonStern MD   This 45 year old female presents for back pain.  History of Present Illness: 1.  back pain    Pt returns to review her imaging.  The patient states that she is not getting any better.  She's having much more pain into her left leg and also feels that she is getting pain into both of her legs at this point.  Her repeat MRI shows a large recurrent disc herniation at L5-S1 which is eccentric leftward approaches causing significant stenosis and bilateral S1 nerve root compression.  In addition there are endplate degenerative changes and significant disc height loss at this level.  As minimal disc bulging at the L4 L5 level but there is significant disc herniation at the L5-S1 level.  On examination today the patient continues to have significant discomfort.  She is positive straight leg raise on the left and cross straight leg raise on the right for left lower extremity pain she is difficulty standing on her toes on the left and has symmetric reflexes at the knees with the absent ankle jerk on the left and one on the right greater than going to plantar stimulation.  She has decreased pain sensation left S1 distribution.  I lengthy talk with the patient about treatment options.  In light of the large recurrent disc herniation and significant back pain as well as bilateral lower extremity symptoms and the marked disc degeneration at the L5-S1 level, I recommended proceeding with redo discectomy but with fusion at this level.  The patient states that she does not want under any circumstances to have to have additional surgery and is aware that she has a significant possibility of re-rupturing the disc even if we do a redo discectomy.  I also think with the extent of degeneration at this level that a redo discectomy is not likely to be of much  benefit to her and that she will continue to have significant low back pain.      Medical/Surgical/Interim History Reviewed, no change.  Last detailed document date:03/08/2016.   PAST MEDICAL HISTORY, SURGICAL HISTORY, FAMILY HISTORY, SOCIAL HISTORY AND REVIEW OF SYSTEMS I have reviewed the patient's past medical, surgical, family and social history as well as the comprehensive review of systems as included on the [facility] history form dated 03/08/2016, which I have signed.  Family History: Reviewed, no changes.  Last detailed document: 03/08/2016.   Social History: Tobacco use reviewed. Reviewed, no changes. Last detailed document date: 03/08/2016.      MEDICATIONS(added, continued or stopped this visit): Started Medication Directions Instruction Stopped   escitalopram 20 mg tablet take 1 tablet by oral route  every day     gabapentin 100 mg capsule take 3 capsule by oral route 3 times every day     Junel 1.5/30 (21) 1.5 mg-30 mcg tablet take 1 tablet by oral route  every day    06/19/2016 Medrol (Pak) 4 mg tablets in a dose pack take as directed  07/13/2016  04/05/2016 Percocet 5 mg-325 mg tablet take 1-2 tablets by oral route  every 4 hours as needed for pain    03/08/2016 tizanidine 2 mg tablet take 1-2 tablet by oral route  every 8 hours as needed for spasm     vitamin b12 Injections as directed     Vitamin D3  2,000 unit capsule Take 1 capsule daily     Xyzal take 1 tablet by oral route  every day in the evening       ALLERGIES: Ingredient Reaction Medication Name Comment  NO KNOWN ALLERGIES     No known allergies.    Vitals Date Temp F BP Pulse Ht In Wt Lb BMI BSA Pain Score  07/13/2016  135/86 91 64 209 35.87  5/10      IMPRESSION Proceed with redo discectomy with MAS PLIF L5-S1 level.  This is for significant degeneration, back pain, bilateral lower extremity pain, recurrent radiculopathy, recurrent disc herniation and  significant stenosis with foraminal stenosis.  Completed Orders (this encounter) Order Details Reason Side Interpretation Result Initial Treatment Date Region  Giving encouragement to exercise Encourage patient to eat a well balanced diet and follow up with primary physcian.         Assessment/Plan # Detail Type Description   1. Assessment Radiculopathy, lumbar region (M54.16).       2. Assessment Herniated nucleus pulposus, lumbar (M51.26).       3. Assessment Low back pain, unspecified back pain laterality, with sciatica presence unspecified (M54.5).       4. 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       5. Assessment Body mass index (BMI) 35.0-35.9, adult (Z68.35).   Plan Orders Today's instructions / counseling include(s) Giving encouragement to exercise.       6. Assessment Degenerative lumbar spinal stenosis (M48.061).   Plan Orders Aspen Lo Sag Rigid Panel Quick.         Pain Assessment/Treatment Pain Scale: 5/10. Method: Numeric Pain Intensity Scale. Location: back. Onset: 10/09/2015. Duration: varies. Quality: discomforting. Pain Assessment/Treatment follow-up plan of care: Pateint taking medication as prescribed..  As above.  The patient was fitted for an LSO brace.  Risks and benefits were discussed in detail with the patient and she wishes to proceed with surgery.  Orders: Diagnostic Procedures: Assessment Procedure  M48.061 Lumbar Spine- AP/Lat  Instruction(s)/Education: Assessment Instruction  Z68.35 Giving encouragement to exercise  Miscellaneous: Assessment   M48.061 Aspen Lo Sag Rigid Panel Quick             Provider:  Danae OrleansJoseph D. Venetia MaxonStern MD  07/14/2016 04:13 PM Dictation edited by: Danae OrleansJoseph D. Venetia MaxonStern    CC Providers: Sharen HonesAnna Voytek [facility] [address] [place], [place] [id number]              Electronically signed by Danae OrleansJoseph D. Venetia MaxonStern MD on  07/14/2016 04:14 PM

## 2016-08-17 NOTE — Anesthesia Preprocedure Evaluation (Addendum)
Anesthesia Evaluation  Patient identified by MRN, date of birth, ID band Patient awake    Reviewed: Allergy & Precautions, NPO status , Patient's Chart, lab work & pertinent test results  History of Anesthesia Complications (+) PONV  Airway Mallampati: II  TM Distance: >3 FB Neck ROM: Full    Dental  (+) Teeth Intact, Dental Advisory Given   Pulmonary former smoker,    Pulmonary exam normal breath sounds clear to auscultation       Cardiovascular Normal cardiovascular exam Rhythm:Regular Rate:Normal     Neuro/Psych    GI/Hepatic   Endo/Other    Renal/GU      Musculoskeletal   Abdominal   Peds  Hematology   Anesthesia Other Findings   Reproductive/Obstetrics                            Anesthesia Physical Anesthesia Plan  ASA: II  Anesthesia Plan: General   Post-op Pain Management:    Induction: Intravenous  Airway Management Planned: Oral ETT  Additional Equipment:   Intra-op Plan:   Post-operative Plan: Extubation in OR  Informed Consent: I have reviewed the patients History and Physical, chart, labs and discussed the procedure including the risks, benefits and alternatives for the proposed anesthesia with the patient or authorized representative who has indicated his/her understanding and acceptance.   Dental advisory given  Plan Discussed with: CRNA and Surgeon  Anesthesia Plan Comments:        Anesthesia Quick Evaluation

## 2016-08-17 NOTE — Interval H&P Note (Signed)
History and Physical Interval Note:  08/17/2016 11:28 AM  Audrey Mora  has presented today for surgery, with the diagnosis of DEGENERATIVE LUMBAR SPINAL STENOSIS  The various methods of treatment have been discussed with the patient and family. After consideration of risks, benefits and other options for treatment, the patient has consented to  Procedure(s) with comments: L5-S1 REDO DECOMPRESSION WITH MAXIMUM ACCESS POSTERIOR LUMBAR INTERBODY FUSION (N/A) - L5-S1 REDO DECOMPRESSION WITH MAXIMUM ACCESS POSTERIOR LUMBAR INTERBODY FUSION as a surgical intervention .  The patient's history has been reviewed, patient examined, no change in status, stable for surgery.  I have reviewed the patient's chart and labs.  Questions were answered to the patient's satisfaction.     [name] D

## 2016-08-17 NOTE — Brief Op Note (Signed)
08/17/2016  2:58 PM  PATIENT:  Audrey Mora  45 y.o. 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, degenerative disc disease, recurrent disc herniation, radiculopathy, lumbago, morbid obesity   PROCEDURE:  Procedure(s): LUMBAR FIVE -SACRAL ONE REDO AND DISKECTOMY DECOMPRESSION WITH MAXIMUM ACCESS POSTERIOR LUMBAR INTERBODY FUSION (N/A) with Posterolateral arthrodesis L 5 - S 1 levels  Decompression greater than for standard PLIF procedure  SURGEON:  Surgeon(s) and Role:    * Maeola HarmanJoseph Quaran Kedzierski, MD - Primary    * Donalee CitrinGary Cram, MD - Assisting  PHYSICIAN ASSISTANT:   ASSISTANTS: Poteat, RN   ANESTHESIA:   general  EBL:  Total I/O In: 2000 [I.V.:2000] Out: 400 [Urine:150; Blood:250]  BLOOD ADMINISTERED:none  DRAINS: none   LOCAL MEDICATIONS USED:  MARCAINE    and LIDOCAINE   SPECIMEN:  No Specimen  DISPOSITION OF SPECIMEN:  N/A  COUNTS:  YES  TOURNIQUET:  * No tourniquets in log *  DICTATION: DICTATION: Patient is a 45 year old with spondylosis , stenosis, recurrent disc herniation and severe back and left lower extremity pain at L5 S 1 levels of the lumbar spine. It was elected to take her to surgery for MASPLIF L 5 S 1 level with posterolateral arthrodesis.  Procedure:   Following uncomplicated induction of GETA, and placement of electrodes for neural monitoring, patient was turned into a prone position on the [place] tableand using AP  fluoroscopy the area of planned incision was marked, prepped with betadine scrub and Duraprep, then draped. Exposure was performed of facet joint complex at L 5 S 1 level and the MAS retractor was placed.5.5 x 35 mm cortical Nuvasive screws were placed at L 5 bilaterally according to standard landmarks using neural monitoring.  A total  laminectomy of L 5 was then performed with disarticulation of facets.  This bone was saved for grafting, combined with Osteocel after being run through bone mill and was placed in bone packing device.  There was a large recurrent disc herniation, which was painstakingly mobilized and removed on both sides of the midline, particularly on the left, where there was dense scar tissue.  Decompression was greater than standard PLIF procedure.  Bilateral L 5 and S 1 nerve roots and thecal sac were carefully and completely decompressed.  Thorough discectomy was performed bilaterally at L 5 S 1 and the endplates were prepared for grafting.  23 x 8 x 12 degree cages were placed in the interspace and positioning was confirmed with AP and lateral fluoroscopy.  10 cc of autograft/Osteocel was packed in the interspace medial to the second cage.   Remaining screws were placed at S 1  and rods were placed.   And the screws were locked and torqued.Final Xrays showed well positioned implants and screw fixation. The posterolateral region was packed with remaining 10 cc of autograft on each side of midline. The wounds were irrigated and then closed with 1, 2-0 and 3-0 Vicryl stitches. Long-acting Marcaine was injected into the deep musculature.  Sterile occlusive dressing was placed with Dermabond. The patient was then extubated in the operating room and taken to recovery in stable and satisfactory condition having tolerated her operation well. Counts were correct at the end of the case.  PLAN OF CARE: Admit to inpatient   PATIENT DISPOSITION:  PACU - hemodynamically stable.   Delay  start of Pharmacological VTE agent (>24hrs) due to surgical blood loss or risk of bleeding: yes

## 2016-08-17 NOTE — Anesthesia Postprocedure Evaluation (Signed)
Anesthesia Post Note  Patient: Linde GillisStephanie B Schnitzler  Procedure(s) Performed: Procedure(s) (LRB): LUMBAR FIVE -SACRAL ONE REDO AND DISKECTOMY DECOMPRESSION WITH MAXIMUM ACCESS POSTERIOR LUMBAR INTERBODY FUSION (N/A)  Patient location during evaluation: PACU Anesthesia Type: General Level of consciousness: awake, awake and alert and oriented Pain management: pain level controlled Vital Signs Assessment: post-procedure vital signs reviewed and stable Respiratory status: spontaneous breathing, nonlabored ventilation and respiratory function stable Cardiovascular status: blood pressure returned to baseline    Last Vitals:  Vitals:   08/17/16 1645 08/17/16 1702  BP:  (!) 96/51  Pulse: 76 82  Resp: 12 16  Temp: 36.7 C     Last Pain:  Vitals:   08/17/16 1630  TempSrc:   PainSc: 0-No pain                 Brettney Ficken COKER

## 2016-08-18 MED ORDER — GABAPENTIN 100 MG PO CAPS: 300.0000 mg | ORAL_CAPSULE | Freq: Three times a day (TID) | ORAL | 3 refills | Status: AC

## 2016-08-18 MED ORDER — HYDROMORPHONE HCL 2 MG PO TABS
2.0000 mg | ORAL_TABLET | Freq: Four times a day (QID) | ORAL | Status: DC | PRN
  Administered 2016-08-18: 4 mg via ORAL
  Filled 2016-08-18: qty 2

## 2016-08-18 MED ORDER — HYDROMORPHONE HCL 2 MG PO TABS: 2.0000 mg | ORAL_TABLET | Freq: Four times a day (QID) | ORAL | 0 refills | Status: AC | PRN

## 2016-08-18 MED ORDER — METHOCARBAMOL 500 MG PO TABS: 500.0000 mg | ORAL_TABLET | Freq: Four times a day (QID) | ORAL | 1 refills | Status: AC | PRN

## 2016-08-18 NOTE — Progress Notes (Signed)
Patient alert and oriented, mae's well, voiding adequate amount of urine, swallowing without difficulty, c/o mild pain at time of discharge. Patient discharged home with family. Script and discharged instructions given to patient. Patient and family stated understanding of instructions given. Patient has an appointment with Dr. Stern.     

## 2016-08-18 NOTE — Progress Notes (Addendum)
Subjective: Patient reports "I'm doing ok. I had a lot of pain overnight"  Objective: Vital signs in last 24 hours: Temp:  [98.1 F (36.7 C)-98.6 F (37 C)] 98.2 F (36.8 C) (12/08 0310) Pulse Rate:  [60-103] 73 (12/08 0310) Resp:  [11-18] 18 (12/08 0310) BP: (96-134)/(51-79) 105/68 (12/08 0310) SpO2:  [93 %-100 %] 98 % (12/08 0310) Weight:  [94.9 kg (209 lb 2 oz)] 94.9 kg (209 lb 2 oz) (12/07 0916)  Intake/Output from previous day: 12/07 0701 - 12/08 0700 In: 2580 [P.O.:480; I.V.:2100] Out: 400 [Urine:150; Blood:250] Intake/Output this shift: No intake/output data recorded.  Alert, conversant. Reports intermittent lumbar & bilateral leg pain, some abdominal cramping. Incison without erythema, swelling, or drainage beneath honeycomb & Derrmabond. Good strength BLE.   Lab Results: No results for input(s): WBC, HGB, HCT, PLT in the last 72 hours. BMET No results for input(s): NA, K, CL, CO2, GLUCOSE, BUN, CREATININE, CALCIUM in the last 72 hours.  Studies/Results: Dg Lumbar Spine 2-3 Views  Result Date: 08/17/2016 CLINICAL DATA:  Surgical posterior fusion of L5-S1. EXAM: DG C-ARM 61-120 MIN; LUMBAR SPINE - 2-3 VIEW FLUOROSCOPY TIME:  1 minutes 5 seconds. COMPARISON:  None. FINDINGS: Four intraoperative fluoroscopic images were obtained of the lower lumbar spine. These demonstrate the patient be status post surgical posterior fusion of L5-S1 with bilateral intrapedicular screw placement and interbody fusion. IMPRESSION: Status post surgical posterior fusion of L5-S1. Electronically Signed   By: Lupita RaiderJames  Green Jr, M.D.   On: 08/17/2016 15:19   Dg C-arm 1-60 Min  Result Date: 08/17/2016 CLINICAL DATA:  Surgical posterior fusion of L5-S1. EXAM: DG C-ARM 61-120 MIN; LUMBAR SPINE - 2-3 VIEW FLUOROSCOPY TIME:  1 minutes 5 seconds. COMPARISON:  None. FINDINGS: Four intraoperative fluoroscopic images were obtained of the lower lumbar spine. These demonstrate the patient be status post surgical  posterior fusion of L5-S1 with bilateral intrapedicular screw placement and interbody fusion. IMPRESSION: Status post surgical posterior fusion of L5-S1. Electronically Signed   By: Lupita RaiderJames  Green Jr, M.D.   On: 08/17/2016 15:19    Assessment/Plan: improving  LOS: 1 day  Mobilize with PT today. Ok to try Dilaudid 2mg  1-4 po q4-6hrs prn pain per DrStern.    Georgiann Cockeroteat, Brian 08/18/2016, 7:24 AM   Patient progressing well.  PT today.  Home in AM.  Trying to find ideal pain management regimen.

## 2016-08-18 NOTE — Discharge Summary (Signed)
Physician Discharge Summary  Patient ID: Audrey GillisStephanie B Mora MRN: [id number] DOB/AGE: [birth year] 45 y.o.  Admit date: 08/17/2016 Discharge date: 08/18/2016  Admission Diagnoses:DEGENERATIVE LUMBAR SPINAL STENOSIS L 5 S 1 level, degenerative disc disease, recurrent disc herniation, radiculopathy, lumbago, morbid obesity      Discharge Diagnoses: DEGENERATIVE LUMBAR SPINAL STENOSIS L 5 S 1 level, degenerative disc disease, recurrent disc herniation, radiculopathy, lumbago, morbid obesity s/p LUMBAR FIVE -SACRAL ONE REDO AND DISKECTOMY DECOMPRESSION WITH MAXIMUM ACCESS POSTERIOR LUMBAR INTERBODY FUSION (N/A) with Posterolateral arthrodesis L 5 - S 1 levels       Active Problems:   Herniated intervertebral disc of lumbar spine   Discharged Condition: good  Hospital Course: Audrey Mora was admitted for surgery with dx recurrent HNP, radiculopathy, and stenosis. Following redo decompression with L5-S1 fusion, she recovered well and transferred to [facility] for nursing care and therapies. She has mobilized nicely. Pain is well controlled with po Dilaudid.   Consults: None  Significant Diagnostic Studies: radiology: X-Ray: intra-op  Treatments: surgery: LUMBAR FIVE -SACRAL ONE REDO AND DISKECTOMY DECOMPRESSION WITH MAXIMUM ACCESS POSTERIOR LUMBAR INTERBODY FUSION (N/A) with Posterolateral arthrodesis L 5 - S 1 levels     Discharge Exam: Blood pressure (!) 102/50, pulse 79, temperature 99 F (37.2 C), resp. rate 18, height 5\' 4"  (1.626 m), weight 94.9 kg (209 lb 2 oz), last menstrual period 06/27/2016, SpO2 100 %. Alert, conversant. Reports intermittent lumbar & bilateral leg pain, some abdominal cramping. Incison without erythema, swelling, or drainage beneath honeycomb & [place]. Good strength BLE.  Better pain control today with Dilaudid. Mobilizing well with PT/OT.   Disposition: Discharge to home. Rx's for Dilaudid and Robaxin for prn home use. She verbalizes understanding of d/c  instructions and already has f/u appt with DrStern.       Medication List    STOP taking these medications   naproxen sodium 220 MG tablet Commonly known as:  ANAPROX     TAKE these medications   CYANOCOBALAMIN IJ Inject as directed every 30 (thirty) days. Last dose given 07-21-16   escitalopram 20 MG tablet Commonly known as:  LEXAPRO Take 20 mg by mouth daily.   gabapentin 100 MG capsule Commonly known as:  NEURONTIN Take 3 capsules (300 mg total) by mouth 3 (three) times daily. What changed:  how much to take   HYDROmorphone 2 MG tablet Commonly known as:  DILAUDID Take 1-2 tablets (2-4 mg total) by mouth every 6 (six) hours as needed for moderate pain or severe pain.   loratadine 10 MG tablet Commonly known as:  CLARITIN Take 10 mg by mouth daily.   methocarbamol 500 MG tablet Commonly known as:  ROBAXIN Take 1 tablet (500 mg total) by mouth every 6 (six) hours as needed for muscle spasms.   norethindrone-ethinyl estradiol 1-20 MG-MCG tablet Commonly known as:  JUNEL FE,GILDESS FE,LOESTRIN FE Take 1 tablet by mouth daily.   sodium chloride 0.65 % Soln nasal spray Commonly known as:  OCEAN Place 1 spray into both nostrils as needed for congestion.   Vitamin D 2000 units Caps Take 2,000 Units by mouth daily.            Durable Medical Equipment        Start     Ordered   08/18/16 1019  For home use only DME 3 n 1  Once     08/18/16 1019   08/18/16 1019  For home use only DME Cane  Once     08/18/16  1019       Signed: Dorian HeckleSTERN,Benjamin Casanas D, MD 08/18/2016, 1:21 PM

## 2016-08-18 NOTE — Discharge Instructions (Signed)

## 2016-08-18 NOTE — Evaluation (Signed)
Occupational Therapy Evaluation Patient Details Name: Audrey Mora MRN: [id number] DOB: 1971/05/10 Today's Date: 08/18/2016    History of Present Illness Pt is a 45 y/o female who presents s/p L5-S1 PLIF on 08/17/16.   Clinical Impression   Patient evaluated by Occupational Therapy with no further acute OT needs identified. All education has been completed and the patient has no further questions. See below for any follow-up Occupational Therapy or equipment needs. OT to sign off. Thank you for referral.      Follow Up Recommendations  No OT follow up    Equipment Recommendations  3 in 1 bedside commode;Other (comment) (cane)    Recommendations for Other Services       Precautions / Restrictions Precautions Precautions: Fall;Back Precaution Booklet Issued: Yes (comment) Precaution Comments: reviewed handout in detail Required Braces or Orthoses: Spinal Brace Spinal Brace: Lumbar corset Restrictions Weight Bearing Restrictions: No      Mobility Bed Mobility Overal bed mobility: Needs Assistance Bed Mobility: Rolling;Sidelying to Sit Rolling: Supervision Sidelying to sit: Supervision       General bed mobility comments: incr time and bed rail used but able to complete task  Transfers Overall transfer level: Needs assistance Equipment used: None Transfers: Sit to/from Stand Sit to Stand: Min guard         General transfer comment: Steadying assist provided as pt powered-up to full standing position.     Balance Overall balance assessment: Needs assistance Sitting-balance support: Feet supported;No upper extremity supported Sitting balance-Leahy Scale: Good     Standing balance support: No upper extremity supported;During functional activity Standing balance-Leahy Scale: Fair                              ADL Overall ADL's : Needs assistance/impaired Eating/Feeding: Independent   Grooming: Wash/dry face;Independent            Upper Body Dressing : Modified independent Upper Body Dressing Details (indicate cue type and reason): don brace and doff Lower Body Dressing: Modified independent;With adaptive equipment;Adhering to back precautions;Sit to/from stand Lower Body Dressing Details (indicate cue type and reason): educated on hip kit  Toilet Transfer: Supervision/safety     Toileting - Clothing Manipulation Details (indicate cue type and reason): educated on toilet aide and where to purchase       General ADL Comments: Pt complete LB dressing simulation and don / doff socks with sock aide. Pt plans to have spouse purchase hip kit and toilet aide in gift shop     Vision     Perception     Praxis      Pertinent Vitals/Pain Pain Assessment: Faces Pain Score: 2  Faces Pain Scale: Hurts a little bit Pain Location: incision site Pain Descriptors / Indicators: Operative site guarding Pain Intervention(s): Monitored during session;Repositioned;Premedicated before session     Hand Dominance Left   Extremity/Trunk Assessment Upper Extremity Assessment Upper Extremity Assessment: Overall WFL for tasks assessed   Lower Extremity Assessment Lower Extremity Assessment: Defer to PT evaluation   Cervical / Trunk Assessment Cervical / Trunk Assessment: Other exceptions Cervical / Trunk Exceptions: s/p surg   Communication Communication Communication: No difficulties   Cognition Arousal/Alertness: Awake/alert Behavior During Therapy: WFL for tasks assessed/performed Overall Cognitive Status: Within Functional Limits for tasks assessed                     General Comments  Exercises       Shoulder Instructions      Home Living Family/patient expects to be discharged to:: Private residence Living Arrangements: Spouse/significant other Available Help at Discharge: Family Type of Home: House Home Access: Stairs to enter [employer] of Steps: 1   Home Layout: Two  level;Able to live on main level with bedroom/bathroom     Bathroom Shower/Tub: Occupational psychologist: Standard Bathroom Accessibility: Yes   Home Equipment: Shower seat;Hand held shower head          Prior Functioning/Environment Level of Independence: Independent                 OT Problem List:     OT Treatment/Interventions:      OT Goals(Current goals can be found in the care plan section) Acute Rehab OT Goals Patient Stated Goal: to go home today if they will let me OT Goal Formulation: With patient Time For Goal Achievement: 09/01/16 Potential to Achieve Goals: Good  OT Frequency:     Barriers to D/C:            Co-evaluation              End of Session Equipment Utilized During Treatment: Back brace Nurse Communication: Mobility status;Precautions  Activity Tolerance: Patient tolerated treatment well Patient left: in bed;with call bell/phone within reach   Time: 0932-0950 OT Time Calculation (min): 18 min Charges:  OT General Charges $OT Visit: 1 Procedure OT Evaluation $OT Eval Moderate Complexity: 1 Procedure G-Codes:    Parke Poisson B 09/07/2016, 10:08 AM  Jeri Modena   OTR/L Pager: [phone] Office: [phone] .

## 2016-08-18 NOTE — Evaluation (Signed)
Physical Therapy Evaluation Patient Details Name: Audrey GillisStephanie B Housey MRN: [id number] DOB: 1970/11/10 Today's Date: 08/18/2016   History of Present Illness  Pt is a [age] y/o female who presents s/p L5-S1 PLIF on 08/17/16.  Clinical Impression  Pt admitted with above diagnosis. Pt currently with functional limitations due to the deficits listed below (see PT Problem List). At the time of PT eval pt was able to perform transfers and ambulation with min guard to min assist for balance support and safety. Pt reports that her L foot feels numb and she demonstrated some mild balance deficits. Would recommend pt walk with shoes on while up with staff today. Pt will benefit from skilled PT to increase their independence and safety with mobility to allow discharge to the venue listed below.       Follow Up Recommendations Outpatient PT;Supervision for mobility/OOB    Equipment Recommendations  3in1 (PT)    Recommendations for Other Services       Precautions / Restrictions Precautions Precautions: Fall;Back Precaution Booklet Issued: Yes (comment) Precaution Comments: Reviewed handout and pt was cued for precautions during functional mobility.  Required Braces or Orthoses: Spinal Brace Spinal Brace: Lumbar corset;Applied in sitting position Restrictions Weight Bearing Restrictions: No      Mobility  Bed Mobility Overal bed mobility: Needs Assistance Bed Mobility: Rolling;Sidelying to Sit Rolling: Supervision Sidelying to sit: Min guard       General bed mobility comments: Hands-on guarding as pt transitioned into full sitting position. VC's for log roll and maintaining precautions.   Transfers Overall transfer level: Needs assistance Equipment used: None Transfers: Sit to/from Stand Sit to Stand: Min guard         General transfer comment: Steadying assist provided as pt powered-up to full standing position.   Ambulation/Gait Ambulation/Gait assistance: Min assist Ambulation  Distance (Feet): 350 Feet Assistive device: None;1 person hand held assist Gait Pattern/deviations: Step-through pattern;Decreased stride length Gait velocity: Decreased Gait velocity interpretation: Below normal speed for age/gender General Gait Details: HHA provided occasionally for balance support, and hands-on guarding provided throughout gait training. Pt with cautious, guarded gait and states that her L foot feels numb.  Stairs Stairs: Yes   Stair Management: One rail Left;Step to pattern;Forwards Number of Stairs: 3 General stair comments: Practiced 1 step x3. VC's for sequencing and general safety.   Wheelchair Mobility    Modified Rankin (Stroke Patients Only)       Balance Overall balance assessment: Needs assistance Sitting-balance support: Feet supported;No upper extremity supported Sitting balance-Leahy Scale: Good     Standing balance support: No upper extremity supported;During functional activity Standing balance-Leahy Scale: Fair                               Pertinent Vitals/Pain Pain Assessment: Faces Faces Pain Scale: Hurts a little bit Pain Location: Incision site Pain Descriptors / Indicators: Operative site guarding Pain Intervention(s): Limited activity within patient's tolerance;Monitored during session;Repositioned    Home Living Family/patient expects to be discharged to:: Private residence Living Arrangements: Spouse/significant other;Children Available Help at Discharge: Family;Available 24 hours/day Type of Home: House Home Access: Stairs to enter   [employer] Corporation[employer]trance Stairs-Number of Steps: 1 Home Layout: Two level;Able to live on main level with bedroom/bathroom Home Equipment: Shower seat      Prior Function Level of Independence: Independent               Hand Dominance   Dominant Hand:  Left    Extremity/Trunk Assessment   Upper Extremity Assessment: Defer to OT evaluation           Lower Extremity  Assessment: Generalized weakness      Cervical / Trunk Assessment: Normal;Other exceptions  Communication      Cognition Arousal/Alertness: Awake/alert Behavior During Therapy: WFL for tasks assessed/performed Overall Cognitive Status: Within Functional Limits for tasks assessed                      General Comments      Exercises     Assessment/Plan    PT Assessment Patient needs continued PT services  PT Problem List Decreased strength;Decreased range of motion;Decreased activity tolerance;Decreased balance;Decreased mobility;Decreased knowledge of use of DME;Decreased safety awareness;Decreased knowledge of precautions;Pain          PT Treatment Interventions DME instruction;Gait training;Stair training;Functional mobility training;Therapeutic activities;Therapeutic exercise;Neuromuscular re-education;Patient/family education    PT Goals (Current goals can be found in the Care Plan section)  Acute Rehab PT Goals Patient Stated Goal: Home tomorrow PT Goal Formulation: With patient Time For Goal Achievement: 08/25/16 Potential to Achieve Goals: Good    Frequency Min 5X/week   Barriers to discharge        Co-evaluation               End of Session Equipment Utilized During Treatment: Gait belt;Back brace Activity Tolerance: Patient tolerated treatment well Patient left: in chair;with call bell/phone within reach Nurse Communication: Mobility status         Time: 0720-0740 PT Time Calculation (min) (ACUTE ONLY): 20 min   Charges:   PT Evaluation $PT Eval Moderate Complexity: 1 Procedure     PT G Codes:        Marylynn PearsonLaura D Yoseline Andersson 08/18/2016, 9:37 AM   Conni SlipperLaura Jayce Kainz, PT, DPT Acute Rehabilitation Services Pager: [phone]

## 2016-08-21 ENCOUNTER — Encounter (HOSPITAL_COMMUNITY): Payer: Self-pay | Admitting: Neurosurgery

## 2016-08-29 ENCOUNTER — Ambulatory Visit (HOSPITAL_COMMUNITY)
Admission: RE | Admit: 2016-08-29 | Discharge: 2016-08-29 | Disposition: A | Discharge status: Home / Self Care | Payer: BLUE CROSS/BLUE SHIELD | Source: Ambulatory Visit | Attending: Vascular Surgery | Admitting: Vascular Surgery

## 2016-08-29 ENCOUNTER — Other Ambulatory Visit (HOSPITAL_COMMUNITY): Payer: Self-pay | Admitting: Neurosurgery

## 2016-08-29 DIAGNOSIS — M7989 Other specified soft tissue disorders: Principal | ICD-10-CM

## 2016-08-29 DIAGNOSIS — M79606 Pain in leg, unspecified: Secondary | ICD-10-CM | POA: Diagnosis not present

## 2016-08-31 ENCOUNTER — Encounter (HOSPITAL_COMMUNITY): Payer: BLUE CROSS/BLUE SHIELD

## 2018-10-30 IMAGING — RF DG LUMBAR SPINE 2-3V
1 series · 4 of 4 positions shown · non-contrast
Comparison: None.

CLINICAL DATA: Surgical posterior fusion of L5-S1.

EXAM:
DG C-ARM 61-120 MIN; LUMBAR SPINE - 2-3 VIEW
FLUOROSCOPY TIME:  1 minutes 5 seconds.

[Series 1: run · 4 of 4 slices shown]
[im 1/4]
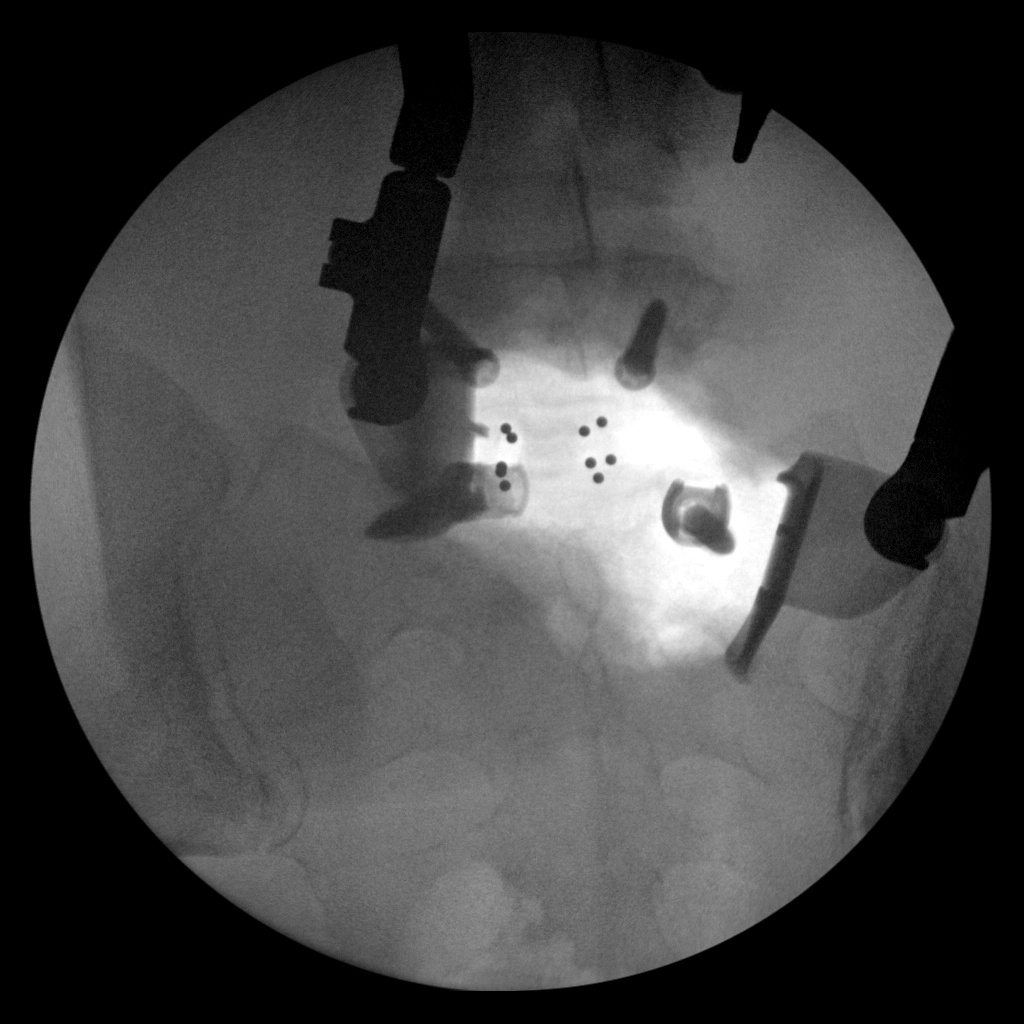
[im 2/4]
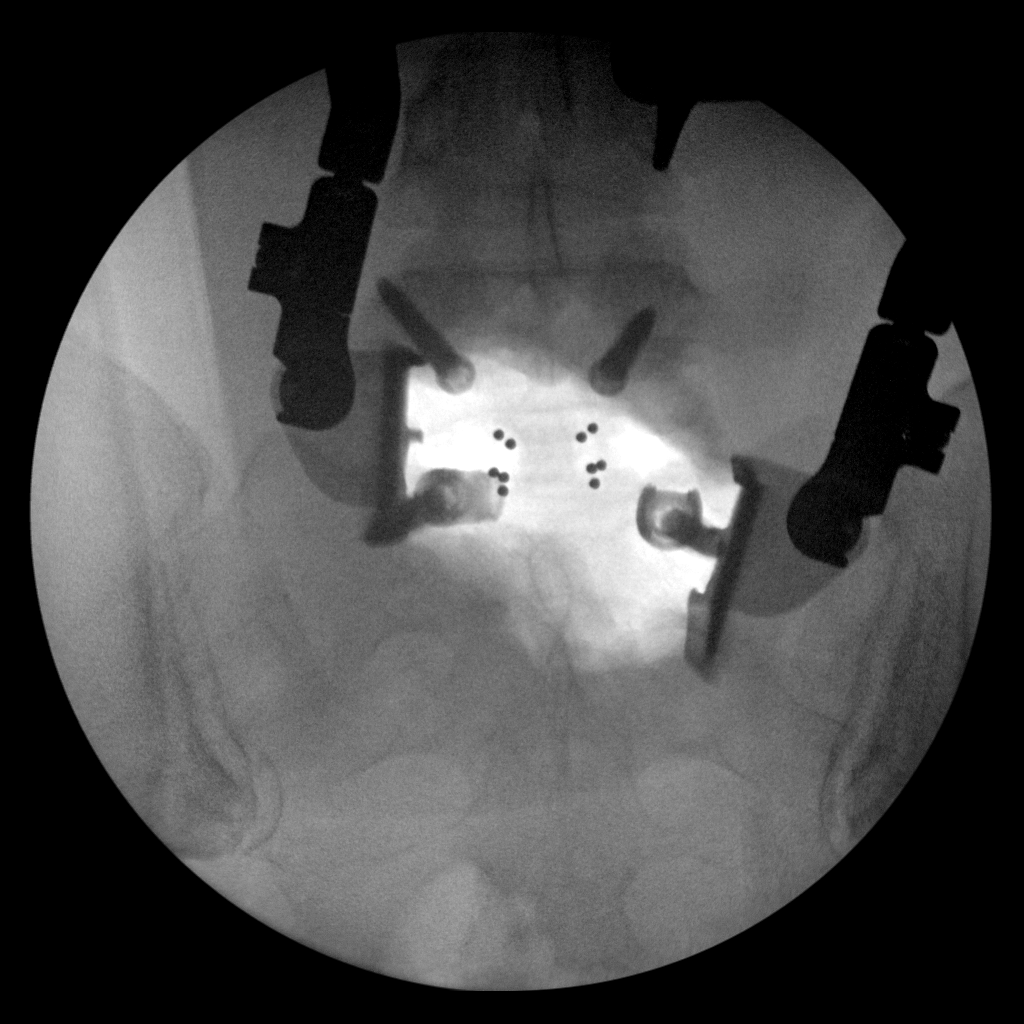
[im 3/4]
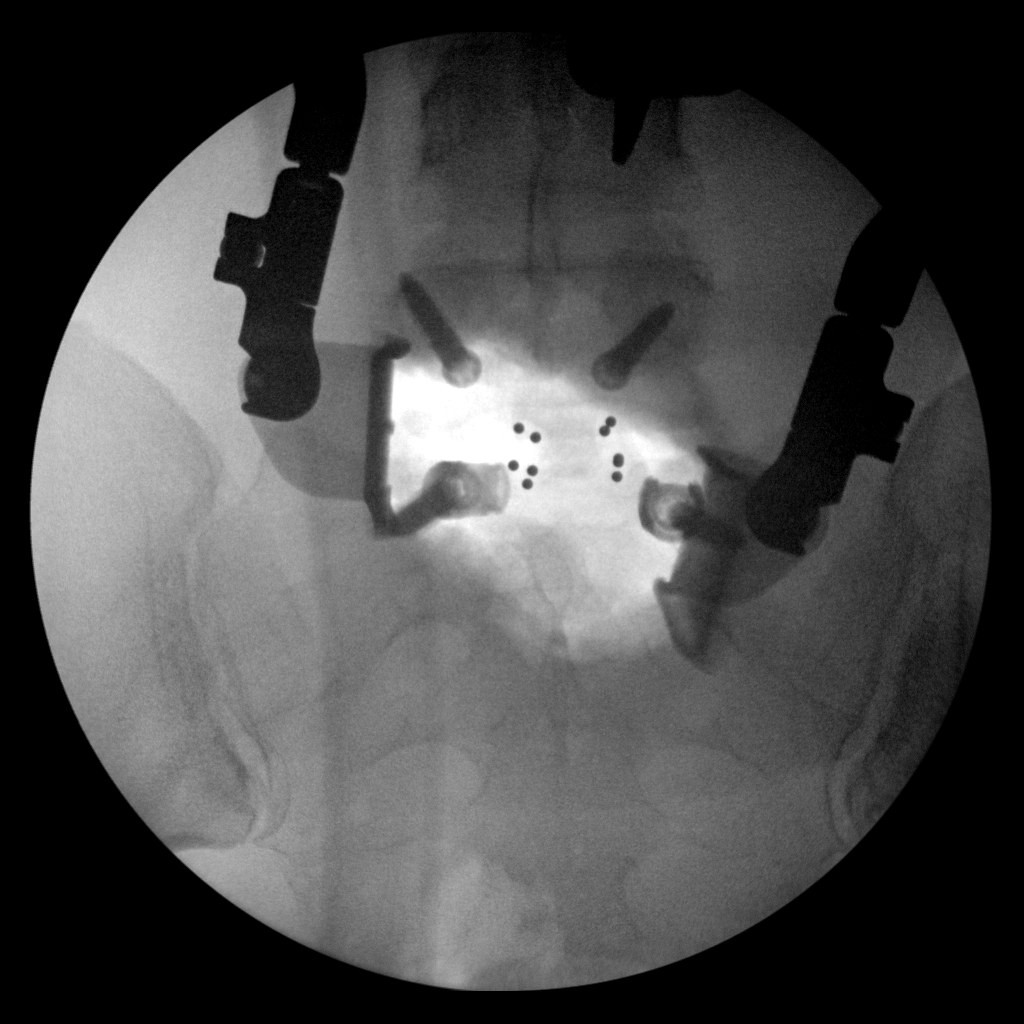
[im 4/4]
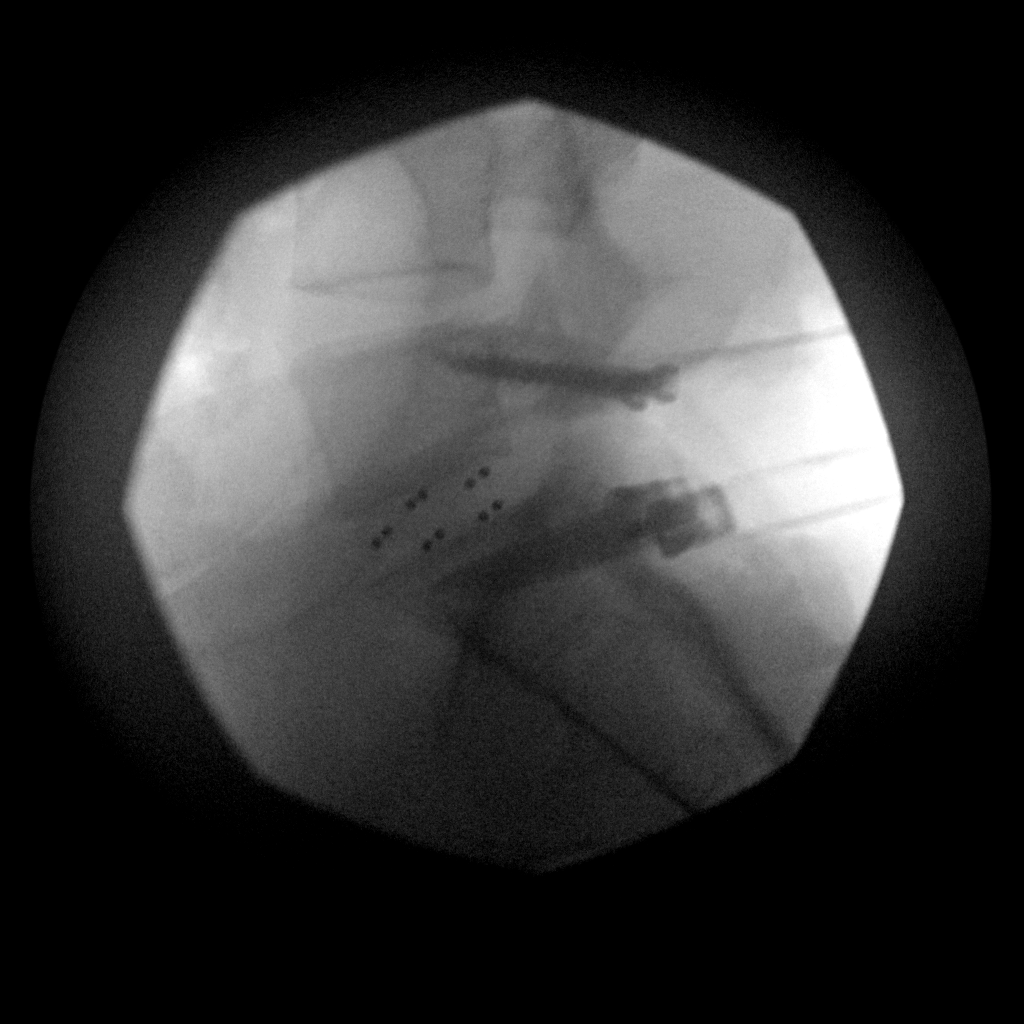

[4 of 4 positions shown; findings below may reference images not displayed]

FINDINGS: Four intraoperative fluoroscopic images were obtained of the lower
lumbar spine. These demonstrate the patient be status post surgical
posterior fusion of L5-S1 with bilateral intrapedicular screw
placement and interbody fusion.
IMPRESSION: Status post surgical posterior fusion of L5-S1.
